# Patient Record
Sex: Male | Born: 1935 | Race: White | Hispanic: No | Marital: Married
Health system: Midwestern US, Academic
[De-identification: ages and names within clinical notes are randomized; demographics above are authoritative.]

---

## 2017-09-01 ENCOUNTER — Encounter: Admit: 2017-09-01 | Discharge: 2017-09-01 | Payer: MEDICARE

## 2017-09-02 ENCOUNTER — Encounter: Admit: 2017-09-02 | Discharge: 2017-09-02 | Payer: MEDICARE

## 2017-09-03 ENCOUNTER — Encounter: Admit: 2017-09-03 | Discharge: 2017-09-03 | Payer: MEDICARE

## 2017-09-03 DIAGNOSIS — I2510 Atherosclerotic heart disease of native coronary artery without angina pectoris: ICD-10-CM

## 2017-09-03 DIAGNOSIS — I10 Essential (primary) hypertension: ICD-10-CM

## 2017-09-03 DIAGNOSIS — I272 Pulmonary hypertension, unspecified: ICD-10-CM

## 2017-09-03 DIAGNOSIS — E785 Hyperlipidemia, unspecified: Principal | ICD-10-CM

## 2017-09-03 DIAGNOSIS — Z952 Presence of prosthetic heart valve: ICD-10-CM

## 2017-09-03 DIAGNOSIS — I779 Disorder of arteries and arterioles, unspecified: ICD-10-CM

## 2017-09-03 DIAGNOSIS — E039 Hypothyroidism, unspecified: ICD-10-CM

## 2017-09-03 DIAGNOSIS — G4733 Obstructive sleep apnea (adult) (pediatric): ICD-10-CM

## 2017-09-08 ENCOUNTER — Encounter: Admit: 2017-09-08 | Discharge: 2017-09-08 | Payer: MEDICARE

## 2017-09-08 ENCOUNTER — Ambulatory Visit: Admit: 2017-09-08 | Discharge: 2017-09-09 | Payer: MEDICARE

## 2017-09-08 DIAGNOSIS — G4733 Obstructive sleep apnea (adult) (pediatric): ICD-10-CM

## 2017-09-08 DIAGNOSIS — I2510 Atherosclerotic heart disease of native coronary artery without angina pectoris: ICD-10-CM

## 2017-09-08 DIAGNOSIS — E785 Hyperlipidemia, unspecified: Principal | ICD-10-CM

## 2017-09-08 DIAGNOSIS — I779 Disorder of arteries and arterioles, unspecified: ICD-10-CM

## 2017-09-08 DIAGNOSIS — I10 Essential (primary) hypertension: ICD-10-CM

## 2017-09-08 DIAGNOSIS — I272 Pulmonary hypertension, unspecified: ICD-10-CM

## 2017-09-08 DIAGNOSIS — Z95810 Presence of automatic (implantable) cardiac defibrillator: Principal | ICD-10-CM

## 2017-09-08 DIAGNOSIS — E039 Hypothyroidism, unspecified: ICD-10-CM

## 2017-09-08 DIAGNOSIS — Z9889 Other specified postprocedural states: ICD-10-CM

## 2017-09-08 DIAGNOSIS — Z952 Presence of prosthetic heart valve: ICD-10-CM

## 2017-09-08 DIAGNOSIS — E782 Mixed hyperlipidemia: ICD-10-CM

## 2017-09-08 LAB — THYROID STIMULATING HORMONE-TSH: Lab: 2.6

## 2017-09-08 LAB — LIPID PROFILE
Lab: 110 {cells}/uL — ABNORMAL LOW (ref 150–200)
Lab: 17 % (ref 33–115)
Lab: 31 {cells}/uL — ABNORMAL LOW (ref 35–60)
Lab: 4 % (ref 6–29)
Lab: 67 {cells}/uL (ref 0–200)
Lab: 86 {cells}/uL (ref 200–950)

## 2017-09-08 LAB — PROTIME INR (PT): Lab: 2.9

## 2017-09-09 ENCOUNTER — Encounter: Admit: 2017-09-09 | Discharge: 2017-09-09 | Payer: MEDICARE

## 2017-09-11 ENCOUNTER — Encounter: Admit: 2017-09-11 | Discharge: 2017-09-11 | Payer: MEDICARE

## 2017-09-29 ENCOUNTER — Ambulatory Visit: Admit: 2017-09-29 | Discharge: 2017-09-30 | Payer: MEDICARE

## 2017-09-29 DIAGNOSIS — Z95810 Presence of automatic (implantable) cardiac defibrillator: Principal | ICD-10-CM

## 2017-09-29 DIAGNOSIS — Z9889 Other specified postprocedural states: ICD-10-CM

## 2017-09-29 DIAGNOSIS — I272 Pulmonary hypertension, unspecified: ICD-10-CM

## 2017-10-23 ENCOUNTER — Ambulatory Visit: Admit: 2017-10-23 | Discharge: 2017-10-24 | Payer: MEDICARE

## 2017-10-24 DIAGNOSIS — Z9889 Other specified postprocedural states: ICD-10-CM

## 2017-10-24 DIAGNOSIS — I272 Pulmonary hypertension, unspecified: Secondary | ICD-10-CM

## 2017-10-24 DIAGNOSIS — Z95810 Presence of automatic (implantable) cardiac defibrillator: Principal | ICD-10-CM

## 2017-11-02 ENCOUNTER — Encounter: Admit: 2017-11-02 | Discharge: 2017-11-02 | Payer: MEDICARE

## 2017-11-02 DIAGNOSIS — Z9889 Other specified postprocedural states: ICD-10-CM

## 2017-11-02 DIAGNOSIS — Z95810 Presence of automatic (implantable) cardiac defibrillator: ICD-10-CM

## 2017-11-02 DIAGNOSIS — I2510 Atherosclerotic heart disease of native coronary artery without angina pectoris: Principal | ICD-10-CM

## 2018-02-10 ENCOUNTER — Encounter: Admit: 2018-02-10 | Discharge: 2018-02-10 | Payer: MEDICARE

## 2018-02-12 ENCOUNTER — Encounter: Admit: 2018-02-12 | Discharge: 2018-02-12 | Payer: MEDICARE

## 2018-02-16 ENCOUNTER — Ambulatory Visit: Admit: 2018-02-16 | Discharge: 2018-02-16 | Payer: MEDICARE

## 2018-02-16 DIAGNOSIS — Z95810 Presence of automatic (implantable) cardiac defibrillator: Secondary | ICD-10-CM

## 2018-02-16 DIAGNOSIS — Z9889 Other specified postprocedural states: Secondary | ICD-10-CM

## 2018-02-16 DIAGNOSIS — I272 Pulmonary hypertension, unspecified: Secondary | ICD-10-CM

## 2018-02-19 ENCOUNTER — Encounter: Admit: 2018-02-19 | Discharge: 2018-02-19 | Payer: MEDICARE

## 2018-02-19 DIAGNOSIS — I2510 Atherosclerotic heart disease of native coronary artery without angina pectoris: Secondary | ICD-10-CM

## 2018-02-19 DIAGNOSIS — Z9889 Other specified postprocedural states: Secondary | ICD-10-CM

## 2018-02-19 DIAGNOSIS — I272 Pulmonary hypertension, unspecified: Secondary | ICD-10-CM

## 2018-02-19 DIAGNOSIS — Z95810 Presence of automatic (implantable) cardiac defibrillator: Secondary | ICD-10-CM

## 2018-02-25 ENCOUNTER — Ambulatory Visit: Admit: 2018-02-25 | Discharge: 2018-02-25 | Payer: MEDICARE

## 2018-02-25 ENCOUNTER — Encounter: Admit: 2018-02-25 | Discharge: 2018-02-25 | Payer: MEDICARE

## 2018-02-25 DIAGNOSIS — I779 Disorder of arteries and arterioles, unspecified: ICD-10-CM

## 2018-02-25 DIAGNOSIS — I272 Pulmonary hypertension, unspecified: ICD-10-CM

## 2018-02-25 DIAGNOSIS — E039 Hypothyroidism, unspecified: ICD-10-CM

## 2018-02-25 DIAGNOSIS — Z952 Presence of prosthetic heart valve: ICD-10-CM

## 2018-02-25 DIAGNOSIS — G4733 Obstructive sleep apnea (adult) (pediatric): ICD-10-CM

## 2018-02-25 DIAGNOSIS — I2510 Atherosclerotic heart disease of native coronary artery without angina pectoris: ICD-10-CM

## 2018-02-25 DIAGNOSIS — E785 Hyperlipidemia, unspecified: Principal | ICD-10-CM

## 2018-02-25 DIAGNOSIS — I10 Essential (primary) hypertension: ICD-10-CM

## 2018-02-25 DIAGNOSIS — E782 Mixed hyperlipidemia: Principal | ICD-10-CM

## 2018-03-18 ENCOUNTER — Encounter: Admit: 2018-03-18 | Discharge: 2018-03-18 | Payer: MEDICARE

## 2018-05-17 ENCOUNTER — Encounter: Admit: 2018-05-17 | Discharge: 2018-05-17 | Payer: MEDICARE

## 2018-05-17 ENCOUNTER — Ambulatory Visit: Admit: 2018-05-17 | Discharge: 2018-05-18 | Payer: MEDICARE

## 2018-05-17 DIAGNOSIS — Z95810 Presence of automatic (implantable) cardiac defibrillator: Principal | ICD-10-CM

## 2018-05-18 DIAGNOSIS — I272 Pulmonary hypertension, unspecified: ICD-10-CM

## 2018-05-18 DIAGNOSIS — Z9889 Other specified postprocedural states: ICD-10-CM

## 2018-05-19 ENCOUNTER — Encounter: Admit: 2018-05-19 | Discharge: 2018-05-19 | Payer: MEDICARE

## 2018-05-19 NOTE — Telephone Encounter
Called pt to discuss and follow up on symptoms.  Pt denies any shortness of breath, swelling or weight gain.  Pt states that his weight will occasionally go up and down a pound or two, but nothing significant.  He will continue to monitor and call us with any new questions, concerns or problems.      ----- Message -----   From: Ninfa Meeker   Sent: 05/18/2018 ??? 1:48 PM CDT   To: Cvm Nurse Gen Card Team Yellow   Subject: Possible fluid retention per Oleta Mouse and TI *     Please note that per [facility] transmission reviewed today, patients Optivol and TI trends are currently suggestive of possible fluid retention since mid April 2020. ???Complete report in chart for further detail/review. ???Please follow up as needed. ???     Thank you,   Terri/Device Team

## 2018-08-16 ENCOUNTER — Ambulatory Visit: Admit: 2018-08-16 | Discharge: 2018-08-17

## 2018-08-16 ENCOUNTER — Encounter: Admit: 2018-08-16 | Discharge: 2018-08-16

## 2018-08-16 DIAGNOSIS — Z95810 Presence of automatic (implantable) cardiac defibrillator: Secondary | ICD-10-CM

## 2018-08-17 DIAGNOSIS — Z95810 Presence of automatic (implantable) cardiac defibrillator: Principal | ICD-10-CM

## 2018-08-17 DIAGNOSIS — I272 Pulmonary hypertension, unspecified: Secondary | ICD-10-CM

## 2018-08-17 DIAGNOSIS — Z9889 Other specified postprocedural states: Secondary | ICD-10-CM

## 2018-09-02 ENCOUNTER — Ambulatory Visit: Admit: 2018-09-02 | Discharge: 2018-09-03

## 2018-09-02 ENCOUNTER — Encounter: Admit: 2018-09-02 | Discharge: 2018-09-02

## 2018-09-02 DIAGNOSIS — E785 Hyperlipidemia, unspecified: Secondary | ICD-10-CM

## 2018-09-02 DIAGNOSIS — I779 Disorder of arteries and arterioles, unspecified: Secondary | ICD-10-CM

## 2018-09-02 DIAGNOSIS — Z952 Presence of prosthetic heart valve: Secondary | ICD-10-CM

## 2018-09-02 DIAGNOSIS — Z95810 Presence of automatic (implantable) cardiac defibrillator: Secondary | ICD-10-CM

## 2018-09-02 DIAGNOSIS — E039 Hypothyroidism, unspecified: Secondary | ICD-10-CM

## 2018-09-02 DIAGNOSIS — G4733 Obstructive sleep apnea (adult) (pediatric): Secondary | ICD-10-CM

## 2018-09-02 DIAGNOSIS — I10 Essential (primary) hypertension: Secondary | ICD-10-CM

## 2018-09-02 DIAGNOSIS — I2510 Atherosclerotic heart disease of native coronary artery without angina pectoris: Secondary | ICD-10-CM

## 2018-09-02 DIAGNOSIS — I272 Pulmonary hypertension, unspecified: Secondary | ICD-10-CM

## 2018-09-02 NOTE — Progress Notes
Date of Service: 09/02/2018    Louis Howard is a 83 y.o. male.       HPI     I saw [name] today regarding followup of his known coronary artery disease and bypass surgery.  He tells me he is asymptomatic, suffering only from mosquito and chigger bites.  He mows his lawn without limitation.  Sometimes if he bends over abruptly, he will get slightly lightheaded when he stands back up, but no presyncope.  No orthopnea, PND, edema, palpitations, lightheadedness, dizziness, or presyncope.     He is quite hard of hearing, and with masks on in the room communication is somewhat problematic.  He is at least partially fluent in [place], but his primary language is [place].    ([id number])             Vitals:    09/02/18 0927   BP: 118/78   BP Source: Arm, Left Upper   Pulse: 75   Temp: 36.5 ???C (97.7 ???F)   SpO2: 95%   Weight: 98.4 kg (217 lb)   Height: 1.753 m (5' 9.02)   PainSc: Zero     Body mass index is 32.03 kg/m???.     Past Medical History  Patient Active Problem List    Diagnosis Date Noted   ??? Hyperlipemia 09/03/2017   ??? Essential hypertension 09/03/2017   ??? Obstructive sleep apnea 09/03/2017   ??? CAD (coronary artery disease) 09/03/2017     01/21/1991  S/P CABG x Overview: LIMA to LAD. SVG to Diagonal.???     08/31/2015 Heart Cath:  NL LM, 50% pLAD, mild midLAD, new ostial CTO of D1, mild OMB1 disease, normal dominant LCX, mild disease nondom RCA. Patent LIMA to mLAD. Patent SVG to D1 with very focal 50% aorto-ostial stenosis d/t vessel taper and modest plaque with normal FFR of 0.98 (no indication for PCI) and suspected, but not proven antegrade CTO of larger D1 bifurcation branch (would explain stress findings) and good retrograde flow into smaller D1 bifurcation branch (nochange from 2011 cath, but actual target vessel anatomy and true SVGanastamosis is indefinite (need pre-CABG cath to be sure). Anatomy is not remarkable changed from 2011 cath       ??? H/O aortic valve replacement 09/03/2017 03/22/2011History of aortic valve replacement Overview: ???21mm Magna pericardial tissue valve ???          ??? Cardiac resynchronization therapy defibrillator (CRT-D) in place 09/03/2017     04/30/2016 S/P AV nodal ablation   04/30/2016 Cardiac resynchronization therapy pacemaker (CRT-P) in place Pacemaker   Pacemaker Gerre Pebbles Crt-P [id number] - Implanted??????  Inventory item: Implant Pacemaker Gerre Pebbles Crt-P [id number] Model/Cat number: [id number] Serial number: [id number] Manufacturer: [employer] CARDIAC RHYTHM MGMT  Lot number: [id number] Implant Date: 04/30/2016 Description: [facility] [id number]  Status: Implanted Mode: VVIR  Lower Rate: 90 Upper Rate: 120        ??? Hx of atrioventricular node ablation 09/03/2017     04/30/2016 S/P AV nodal ablation     11/16/2015 S/P radiofrequency ablation operation for arrhythmia Overview:  Successful electrophysiology study and pulmonary vein cryoablation for treatment of atrial fibrillation. Successful linear radiofrequency catheter ablation of the left atrial roof and cavo-tricuspid isthmus for treatment of atrial flutter.         ??? Pulmonary hypertension (HCC) 09/03/2017     07/30/2015            ??? Hypothyroid 09/03/2017   ??? Carotid arterial disease (HCC) 09/03/2017  11/27/2016 Carotid artery duplex:  Moderate to severe plaquing bilaterally. There are no associated velocity elevations to suggest hemodynamic narrowing.           Review of Systems   Constitution: Negative.   HENT: Negative.    Eyes: Negative.    Cardiovascular: Negative.    Respiratory: Negative.    Endocrine: Negative.    Hematologic/Lymphatic: Negative.    Skin: Negative.    Musculoskeletal: Negative.    Gastrointestinal: Negative.    Genitourinary: Negative.    Neurological: Negative.    Psychiatric/Behavioral: Negative.    Allergic/Immunologic: Negative.        Physical Exam  Examination reveals an 83 year old gentleman with a mask with some limiting presbycusis.  Extraocular motions are intact.  No nystagmus.  No wheezing.  No jugular venous distention.  No thyromegaly.  Abdomen is mildly obese.  He has some slight stasis dermatitis changes only.  No clubbing.  Affect is normal.  Gait is normal.  Muscle strength is normal.    ([id number])        Cardiovascular Studies  Deferred.    ([id number])        Problems Addressed Today  No diagnosis found.    Assessment and Plan     In summary, this is an 83 year old gentleman with coronary artery disease on risk factor modification who is asymptomatic.  We are not making any changes.  I would like to have him back in about 6 months when I suspect we will have a vaccine available for general use and for high-risk patients.    ([id number])             Current Medications (including today's revisions)  ??? atorvastatin (LIPITOR) 20 mg tablet Take 1 tablet by mouth daily.   ??? cetirizine (ZYRTEC) 10 mg tablet Take 10 mg by mouth daily.   ??? cholecalciferol (VITAMIN D-3) 1,000 units tablet Take 1,000 Units by mouth daily.   ??? eszopiclone (LUNESTA) 3 mg tablet Take 3 mg by mouth daily.   ??? furosemide (LASIX) 40 mg tablet Take 1 tablet by mouth daily.   ??? ketoconazole (NIZORAL) 2 % topical cream Apply  topically to affected area as Needed.   ??? lansoprazole DR(+) (PREVACID) 30 mg capsule Take 1 capsule by mouth daily.   ??? levothyroxine (SYNTHROID) 88 mcg tablet Take 1 tablet by mouth daily.   ??? lisinopril (PRINIVIL, ZESTRIL) 2.5 mg tablet Take 2.5 mg by mouth daily.   ??? metoprolol XL (TOPROL XL) 50 mg extended release tablet Take 1 tablet by mouth daily.   ??? nitroglycerin (NITROSTAT) 0.4 mg tablet Place 0.4 mg under tongue every 5 minutes as needed.   ??? polyethylene glycol 3350 (MIRALAX) 17 g packet take 1 packet (17G)  by oral route  every day mixed with 8 oz. water, juice, soda, coffee or tea   ??? potassium chloride SR (K-DUR) 20 mEq tablet Take 1 tablet by mouth daily. ??? triamcinolone acetonide (KENALOG) 0.025 % topical cream Apply  topically to affected area as Needed.   ??? vitamins, multiple cap Take 1 capsule by mouth daily.   ??? warfarin (COUMADIN) 2 mg tablet Take 2 mg by mouth three times weekly. Patient takes 4mg  on Tuesday, Thursday, Saturday, and Sunday and 6mg  on Monday, Wednesday, and Friday   ??? warfarin (COUMADIN) 4 mg tablet Take 4 mg by mouth daily. Patient takes 4mg  on Tuesday, Thursday, Saturday, and Sunday and 6mg  on Monday, Wednesday, and Friday

## 2018-10-11 ENCOUNTER — Encounter: Admit: 2018-10-11 | Discharge: 2018-10-11 | Payer: MEDICARE

## 2018-10-11 NOTE — Telephone Encounter
-----   Message from Paticia Stack, RN sent at 10/11/2018  9:47 AM CDT -----  Regarding: FW: unscheduled remote / ppm w/NSVT and ongoing fl overload    ----- Message -----  From: Olena Mater, RN  Sent: 10/09/2018  10:29 AM CDT  To: Cvm Nurse Gen Card Team Yellow  Subject: unscheduled remote / ppm w/NSVT and ongoing #    9/12 remote showing possible fl overload/HF ongoing for ~5days days at the time of the transmission.    Stored EGMs are consistent with or suggestive of Non-sustained VT/VF  Total episodes: 3 -all similar showing NSVT ~1-3sec V rates 167-222bpm    In clinic appointment needed. Patient should come in for an office visit. overdue for bi-annual in office check (last was 09/2017)orders updated and scheduling flagged    Please f/u as needed.    Thanks,  Kaylee/device team

## 2018-10-11 NOTE — Telephone Encounter
Called and discussed with patient. Patient states on the date of these events he was in the ER with decreased O2 sats due to testing positive for COVID and was hospitalized. Patient has since been [place] to home and feeling better. Patient states he is following up with infection control doctor and pcp.    Will route to Dr. Aris [place].

## 2018-11-04 ENCOUNTER — Ambulatory Visit: Admit: 2018-11-04 | Discharge: 2018-11-04 | Payer: MEDICARE

## 2018-11-04 ENCOUNTER — Encounter: Admit: 2018-11-04 | Discharge: 2018-11-04 | Payer: MEDICARE

## 2018-11-04 DIAGNOSIS — I2510 Atherosclerotic heart disease of native coronary artery without angina pectoris: Secondary | ICD-10-CM

## 2018-11-04 DIAGNOSIS — Z9889 Other specified postprocedural states: Secondary | ICD-10-CM

## 2018-11-18 ENCOUNTER — Encounter: Admit: 2018-11-18 | Discharge: 2018-11-18 | Payer: MEDICARE

## 2019-02-14 ENCOUNTER — Ambulatory Visit: Admit: 2019-02-14 | Discharge: 2019-02-14 | Payer: MEDICARE

## 2019-02-14 DIAGNOSIS — Z95810 Presence of automatic (implantable) cardiac defibrillator: Secondary | ICD-10-CM

## 2019-02-18 ENCOUNTER — Encounter: Admit: 2019-02-18 | Discharge: 2019-02-18 | Payer: MEDICARE

## 2019-02-18 DIAGNOSIS — Z950 Presence of cardiac pacemaker: Secondary | ICD-10-CM

## 2019-06-02 ENCOUNTER — Encounter: Admit: 2019-06-02 | Discharge: 2019-06-02 | Payer: MEDICARE

## 2019-06-02 NOTE — Telephone Encounter
Called pt to f/u on abnormal unscheduled remote.  Pt states he has switched care to [facility] and will f/u with them tomorrow.  No further needs at this time.           [name], RN  P Cvm Nurse [place]    Previous Messages    ?  ----- Message -----   From: Louis Howard   Sent: 06/02/2019 ? 9:14 AM CDT   To: Cvm Nurse Gen Card Team Yellow   Subject: Remote alert for NSVT. ? ? ? ? ? ? ? ? ? ? ?     A Yellow Alert was reported by the device: Alert for VT-monitored episode. One VT-monitored episode 06/01/19 at 22:25, lasted 7 seconds, 176 bpm. Egm showed NSVT given history of AV node ablation. Also, two VT-NS episodes, longest 2 seconds. 157 to 171 bpm. VT-NS episodes occurred 05/29/19 and 05/27/19. Egms showed NSVT.

## 2020-01-21 DEATH — deceased

## 2021-11-22 ENCOUNTER — Encounter: Admit: 2021-11-22 | Discharge: 2021-11-22 | Payer: MEDICARE

## 2022-01-07 IMAGING — CT ABDOMEN_PELVIS W(Adult)
2 of 4 series · 13 of 46 positions shown, 15 images · non-contrast
Comparison: No relevant prior studies available.

EXAM:  CT ABDOMEN AND PELVIS WITH INTRAVENOUS CONTRAST  (08000)
INDICATION: Abdominal pain.  Nausea for two weeks.  History of bladder cancer.  Bowel resection.
TECHNIQUE: Axial computed tomography images of the abdomen and pelvis with intravenous contrast.
Sagittal and coronal reformatted images were created and reviewed.

[Series 8: abdomen_pelvis ax 3.00 br40 s3 · axial · 0.60mm/px · z∈[+1426,+1783]mm · 10 of 147 slices shown, 12 images]
[im 14/147  soft-tissue]
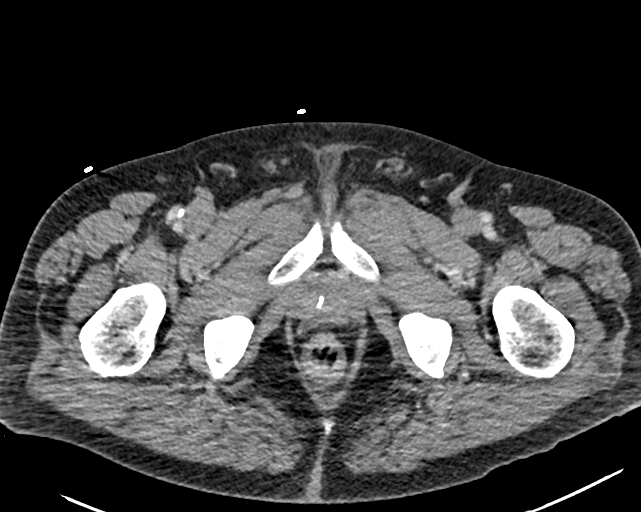
[im 14/147  bone]
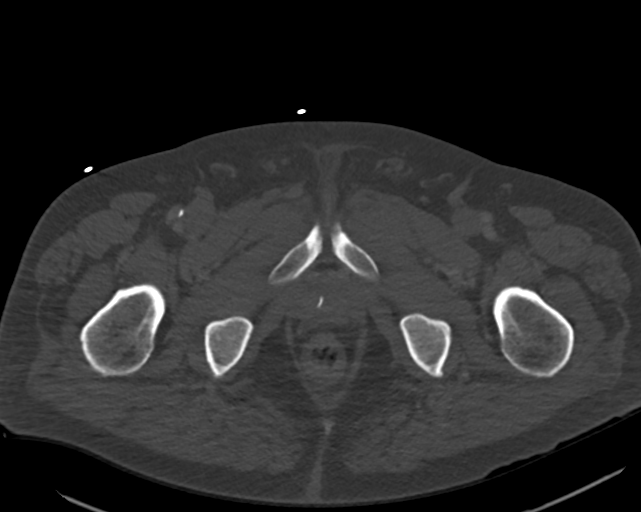
[im 27/147  soft-tissue]
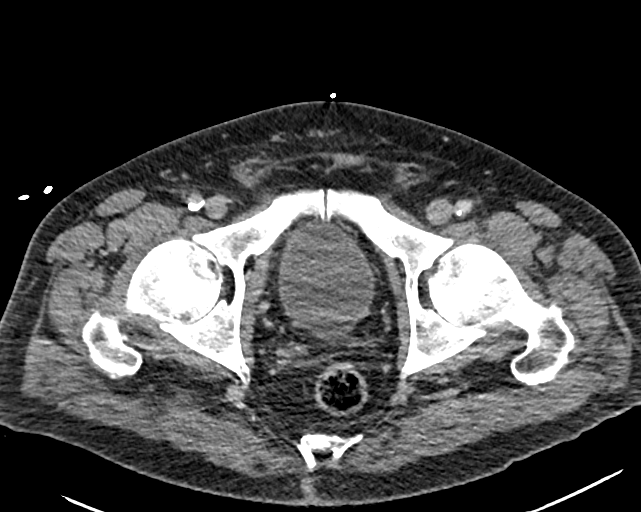
[im 40/147  soft-tissue]
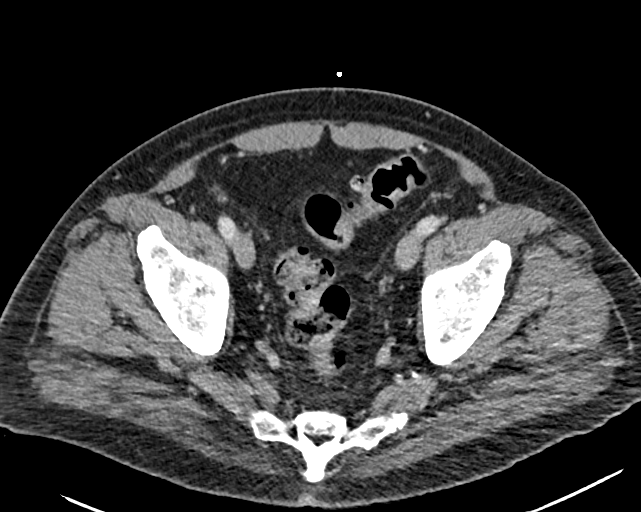
[im 54/147  soft-tissue]
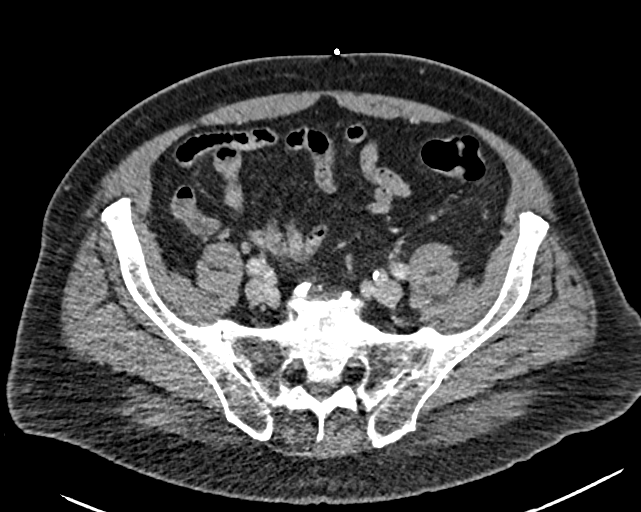
[im 67/147  soft-tissue]
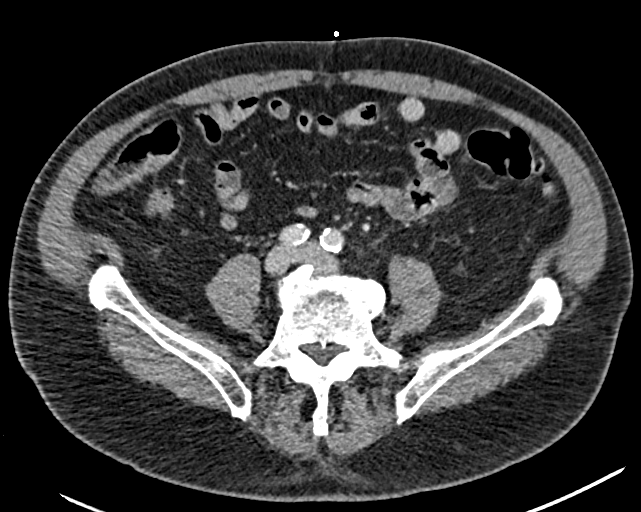
[im 80/147  soft-tissue]
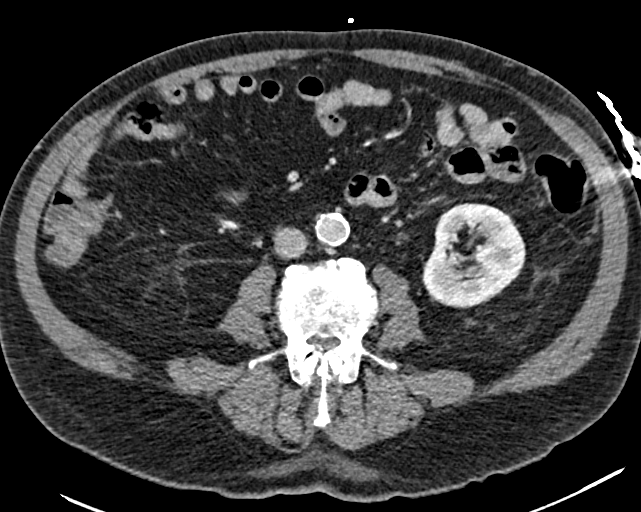
[im 93/147  soft-tissue]
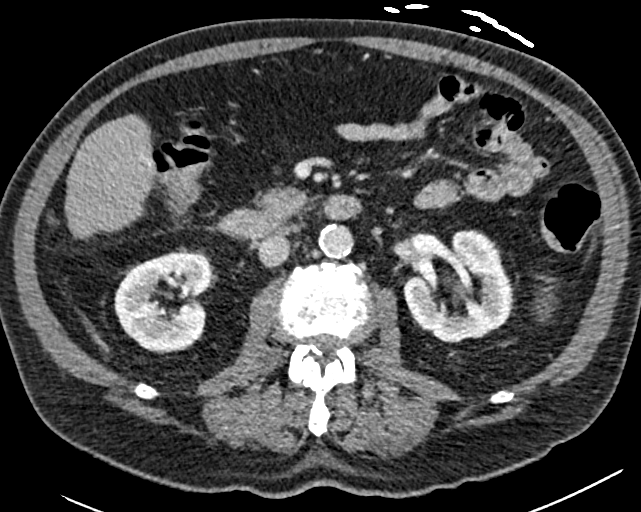
[im 107/147  soft-tissue]
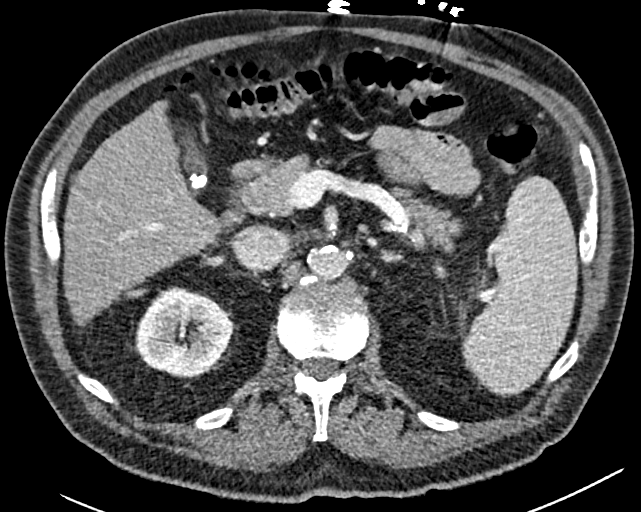
[im 120/147  soft-tissue]
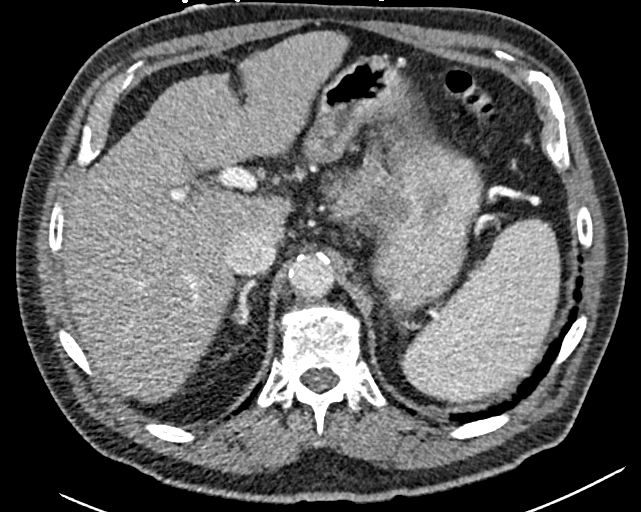
[im 120/147  bone]
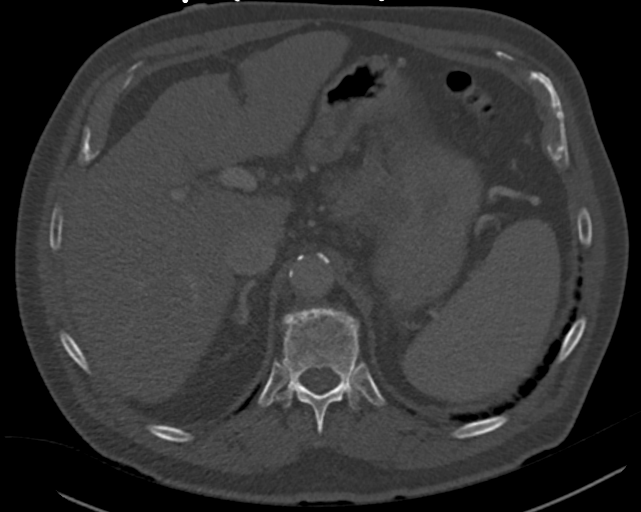
[im 133/147  soft-tissue]
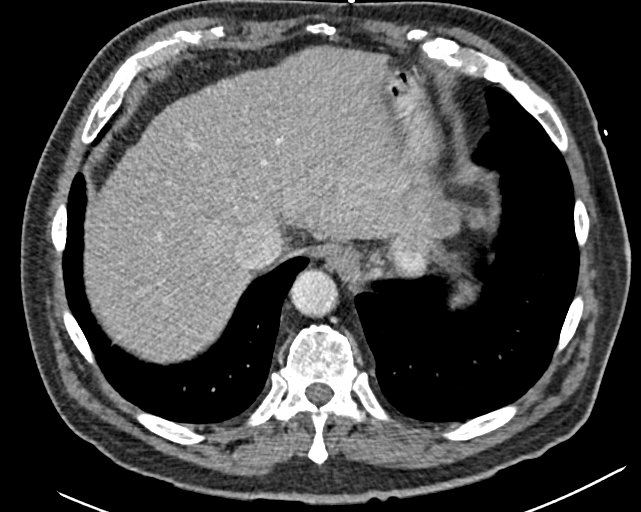

[Series 10: abdomen_pelvis cor 3.00 br40 s3 · coronal · 0.75mm/px · 3 of 102 slices shown]
[im 34/102  soft-tissue]
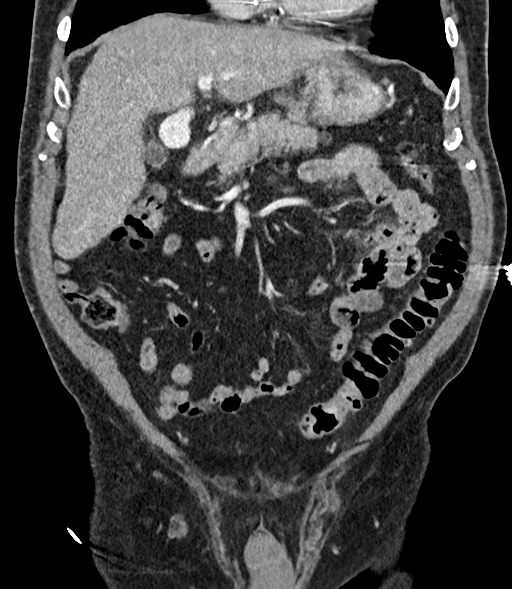
[im 45/102  soft-tissue]
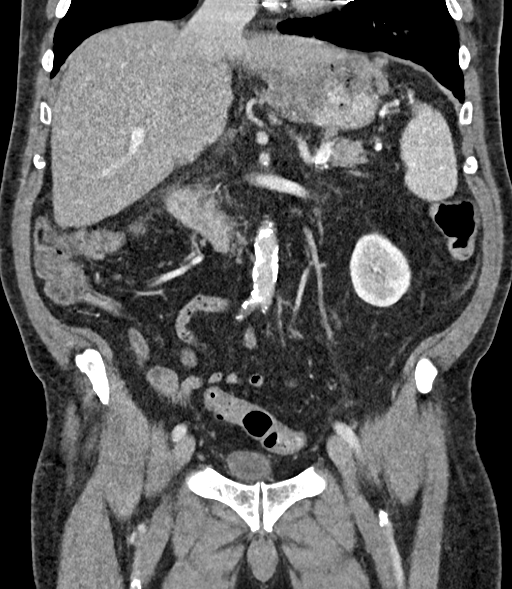
[im 57/102  soft-tissue]
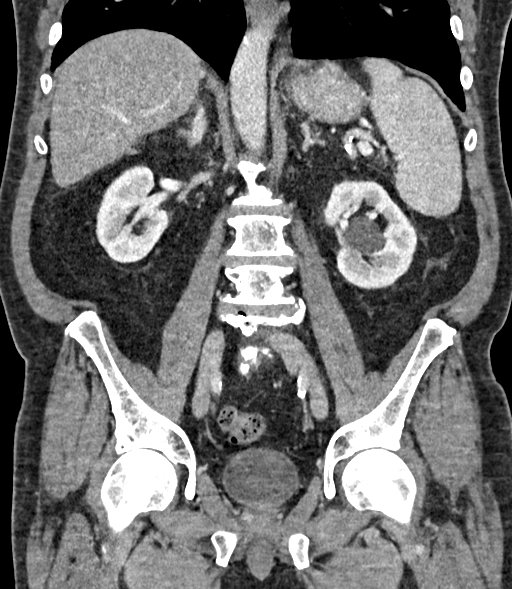

[13 of 46 positions shown; findings below may reference images not displayed]

All CT scans at this facility use dose modulation, interval reconstruction, and/or weight-based
dosing when appropriate to reduce radiation dose to as low as reasonably achievable.

Number of previous computed tomography exams in the last 12 months is 0.

Number of previous nuclear medicine myocardial perfusion studies in the last 12 months is 0.

CONTRAST:  100 mL Omni 300
FINDINGS: LUNG BASES:  Pulmonary interstitium is mildly accentuated.

LIVER:  Cirrhosis morphology.

GALLBLADDER AND BILE DUCTS:  Cholelithiasis.  Contracted gallbladder.  Pericholecystic haziness
nonspecific.  No CT evidence of cholecystitis.

PANCREAS:  No discrete lesion.

SPLEEN:  Splenomegaly.

ADRENALS:  No discrete lesion.

KIDNEYS AND URETERS:  Left-sided parapelvic cysts.

STOMACH AND BOWEL:  Approximately 5.2 x 5.1 cm exophytic gastric mass arising from the proximal
lesser curvature of the stomach neoplastic until proven otherwise.

Diverticulosis

Bowel wall edema most notably affecting the ascending colon.

APPENDIX:  No findings to suggest acute appendicitis.

BLADDER:  Bladder within normal limits.

REPRODUCTIVE:  Prominent prostate with dystrophic calcification

INTRAPERITONEAL SPACE:  No pathologic free or loculated fluid. No free air.

BONES/JOINTS:  Osseous degenerative and spondylotic changes.

VASCULATURE:  Atherosclerosis.

Large portal vein.

Varices.  Portal vein caliber greater than 13 mm.

LYMPH NODES:  Adenopathy in the perigastric region.

TUBES, LINES AND DEVICES:  Fractionally seen cardiac pacer device.

OTHER FINDINGS:  Right-sided cortical probable cyst incompletely assessed.

Spinal canal stenosis
IMPRESSION: - Approximately 5.2 x 5.1 cm exophytic gastric mass arising from the proximal lesser curvature of
the stomach neoplastic until proven otherwise.  Diffuse gastric wall thickening.

- Mild prostatomegaly; please correlate with PSA.

- CT findings compatible with cirrhosis, portal hypertension, and hypoalbuminemia.

- There are other chronic/incidental findings as described.

RECOMMENDATIONS:

- Follow-up with a GI service recommended.

FINDING CONSIDERED CRITICAL TO PATIENT CARE.  Case discussed telephonically with Ken Bori

Tech Notes:

pt c/o nausea x2 weeks. hx bladder cancer, bowel resection, COVID. HCCMN [id number] GIVEN VIA IV. AK
NO PREV
CT/NM:0/0

## 2022-01-07 IMAGING — CR CHEST
1 series · 1 of 1 positions shown · non-contrast
Comparison: No relevant prior studies available.

EXAM:  XR CHEST, 1 VIEW  (82017)
INDICATION: Shortness of breath.  Weakness.  Multiple stents subluxations.

[chest port x-wise]
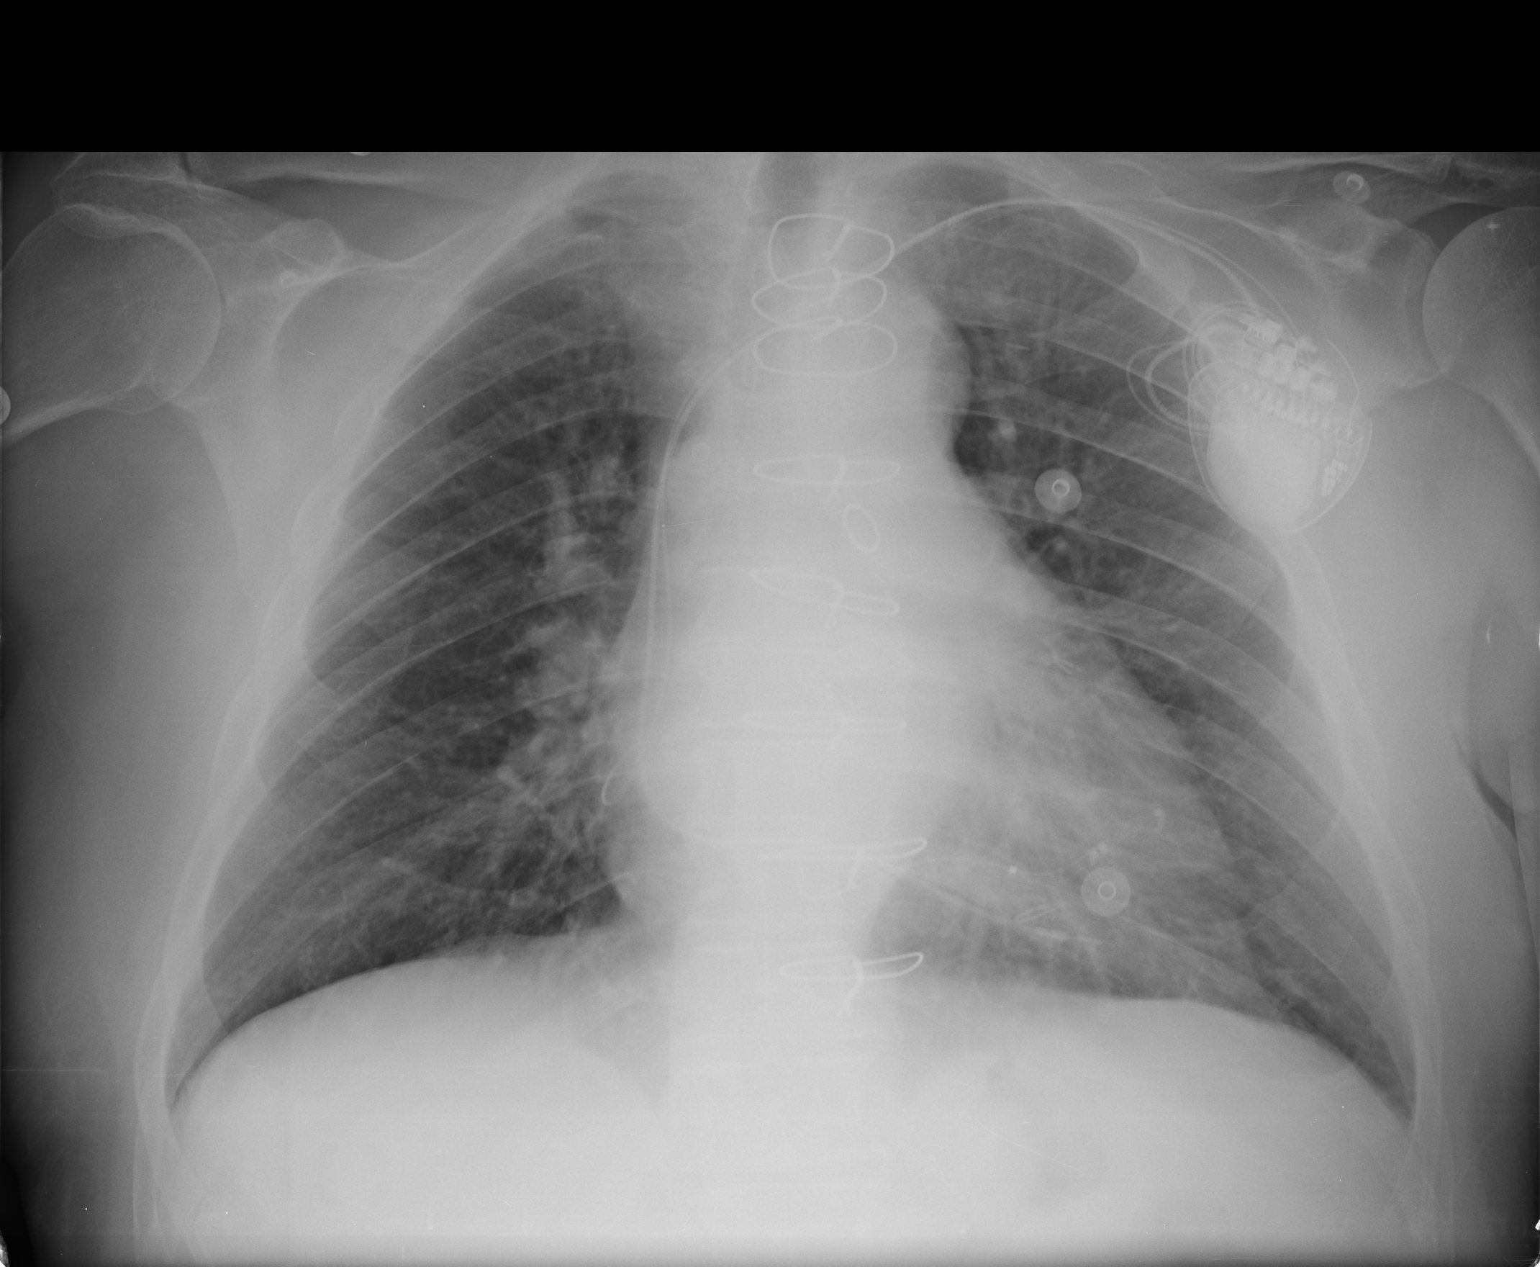

[1 of 1 positions shown; findings below may reference images not displayed]

FINDINGS: LUNGS:  Early pulmonary edema pattern of unknown chronicity.

Dominant central peribronchial cuffing is nonspecific.  Increased interstitial markings.
Hyperinflation.

Bronchitis pattern.

HEART:  Cardiomegaly.

Post CABG changes.

BONES/JOINTS:  Osseous degenerative and spondylotic changes.  Limited view of the thoracic spine.

VASCULATURE:  Tortuous aorta.

TUBES, LINES AND DEVICES:  Cardiac pacer device without acute complicating features.
IMPRESSION: - Early pulmonary edema pattern of unknown chronicity. Correlation with CHF clinically recommended.

- There are other chronic/incidental findings as described.

Tech Notes:

pt c/o weakness. hx of CABG, pacemaker, multiple stents and ablations. AK

## 2023-10-25 ENCOUNTER — Encounter: Admit: 2023-10-25 | Discharge: 2023-10-25 | Payer: MEDICARE
